# Patient Record
Sex: Female | Born: 1995 | Race: White | Hispanic: No | Marital: Single | Smoking: Never smoker
Health system: Southern US, Community
[De-identification: ages and names within clinical notes are randomized; demographics above are authoritative.]

---

## 2014-10-12 ENCOUNTER — Other Ambulatory Visit: Payer: Self-pay | Admitting: Sports Medicine

## 2014-10-12 ENCOUNTER — Ambulatory Visit
Admission: RE | Admit: 2014-10-12 | Discharge: 2014-10-12 | Disposition: A | Discharge status: Home / Self Care | Payer: Managed Care, Other (non HMO) | Source: Ambulatory Visit | Attending: Sports Medicine | Admitting: Sports Medicine

## 2014-10-12 ENCOUNTER — Encounter: Payer: Self-pay | Admitting: Sports Medicine

## 2014-10-12 ENCOUNTER — Ambulatory Visit (INDEPENDENT_AMBULATORY_CARE_PROVIDER_SITE_OTHER): Payer: Managed Care, Other (non HMO) | Admitting: Sports Medicine

## 2014-10-12 VITALS — BP 119/91 | HR 104 | Ht 69.0 in | Wt 160.0 lb

## 2014-10-12 DIAGNOSIS — M25562 Pain in left knee: Secondary | ICD-10-CM

## 2014-10-12 NOTE — Progress Notes (Signed)
   Subjective:    Patient ID: Kayla BookbinderAnn Coffey, female    DOB: March 04, 1996, 19 y.o.   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 While playing ultimate Frisbee she planted her left foot to change direction and felt a pop in her knee. Immediate pain and instability. She tried to return to the game but had a second episode of instability. She developed swelling shortly thereafter. Her pain is diffuse around the knee. She was evaluated by the athletic trainer on-site and told that she may have an anterior cruciate ligament tear. She denies any prior surgeries to this knee. She did suffer a prior MCL sprain to this knee which healed with conservative treatment. No associated numbness or tingling. Past medical history reviewed Surgical history reviewed. Surgical history significant for a previous Lisfranc repair of her left foot at the age of 19. Medications reviewed Allergies reviewed She does not smoke. 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 [place] from [place]    Review of Systems     Objective:   Physical Exam Well-developed, well-nourished. No acute distress. Awake alert and oriented 3. Vital signs reviewed.  Left knee: Nearly full range of motion. Trace effusion. Extensor mechanism is intact. Positive Lachman's, positive anterior drawer. There is a mild amount of laxity with MCL stressing but it is nonpainful. No joint line tenderness. Negative McMurray's. Neurovascularly intact distally.       Assessment & Plan:  Left knee pain and swelling worrisome for anterior cruciate ligament tear  We will get x-rays as well as an MRI of the left knee. Patient will follow-up with me in the office 1-2 days after that study to go over the results and delineate definitive treatment. In the meantime we'll fit her with a double upright brace and she will start daily  isometric quad strengthening exercises. No ultimate Frisbee until results of the MRI are known.

## 2014-10-19 ENCOUNTER — Telehealth: Payer: Self-pay | Admitting: Sports Medicine

## 2014-10-19 ENCOUNTER — Ambulatory Visit
Admission: RE | Admit: 2014-10-19 | Discharge: 2014-10-19 | Disposition: A | Discharge status: Home / Self Care | Payer: Managed Care, Other (non HMO) | Source: Ambulatory Visit | Attending: Sports Medicine | Admitting: Sports Medicine

## 2014-10-19 DIAGNOSIS — M25562 Pain in left knee: Secondary | ICD-10-CM

## 2014-10-19 NOTE — Telephone Encounter (Signed)
I spoke with the patient on the phone today regarding MRI of her left knee. Surprisingly she did not tear her anterior cruciate ligament. Collateral ligaments are intact as well. No evidence of meniscal tear. She does have some bone bruising at the anterior aspect of the lateral tibial plateau and along the lateral side of the lateral femoral condyle. No discrete fracture is seen. Based on these findings I recommend that she discontinue her double upright brace in favor of a simple knee sleeve. She will continue with rehabilitation under the guidance of Randa Lynnmily Williams at [facility] [employer] [employer]. She will follow-up with me in 3 weeks. She will remain out of [employer] [facility] [place] until that visit. She will call with questions or concerns in the interim.

## 2014-10-21 ENCOUNTER — Ambulatory Visit: Payer: Managed Care, Other (non HMO) | Admitting: Sports Medicine

## 2014-11-09 ENCOUNTER — Ambulatory Visit: Payer: Managed Care, Other (non HMO) | Admitting: Sports Medicine

## 2014-11-16 ENCOUNTER — Ambulatory Visit: Payer: Managed Care, Other (non HMO) | Admitting: Sports Medicine

## 2016-07-25 IMAGING — CR DG KNEE AP/LAT W/ SUNRISE*L*
3 series · 3 of 3 positions shown · non-contrast
Comparison: None.

CLINICAL DATA: 18-year-old female twisting injury during sports 1
week ago. Persistent knee pain. Initial encounter.

EXAM:
LEFT KNEE 3 VIEWS

[view not recorded (1 of 3)]
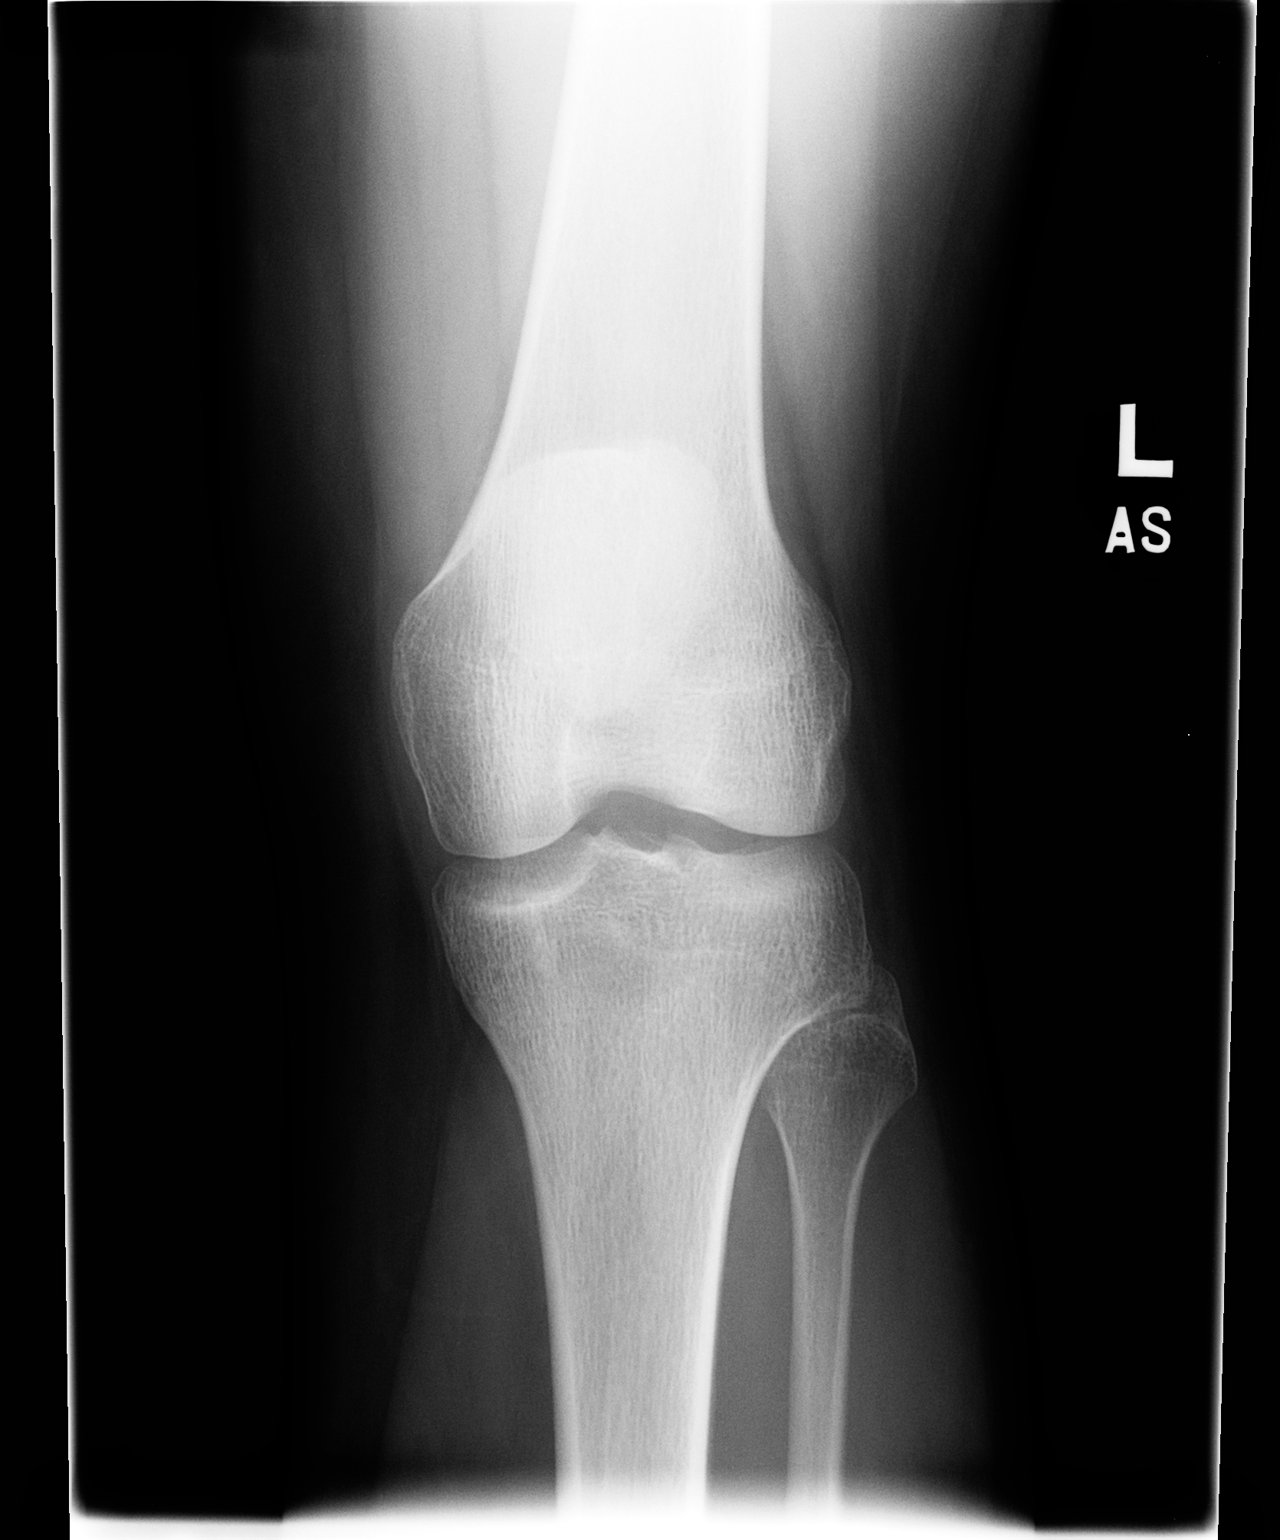

[view not recorded (2 of 3)]
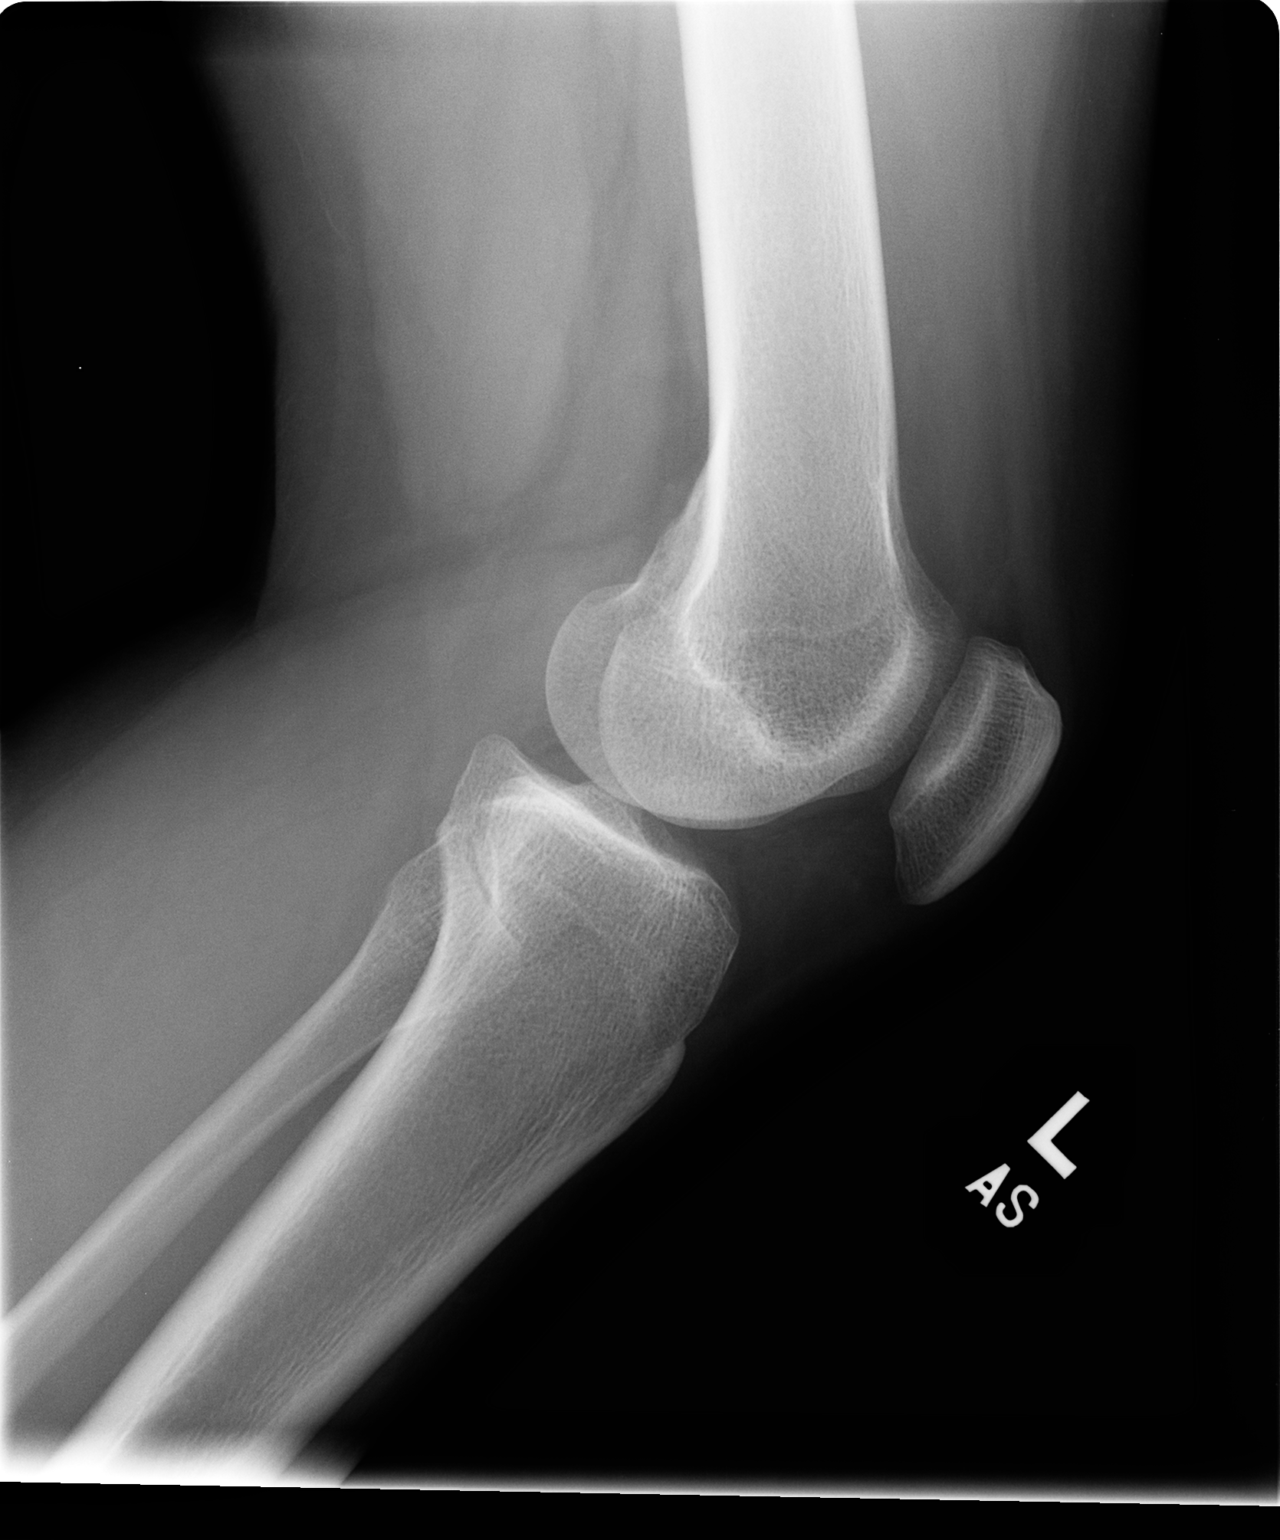

[view not recorded (3 of 3)]
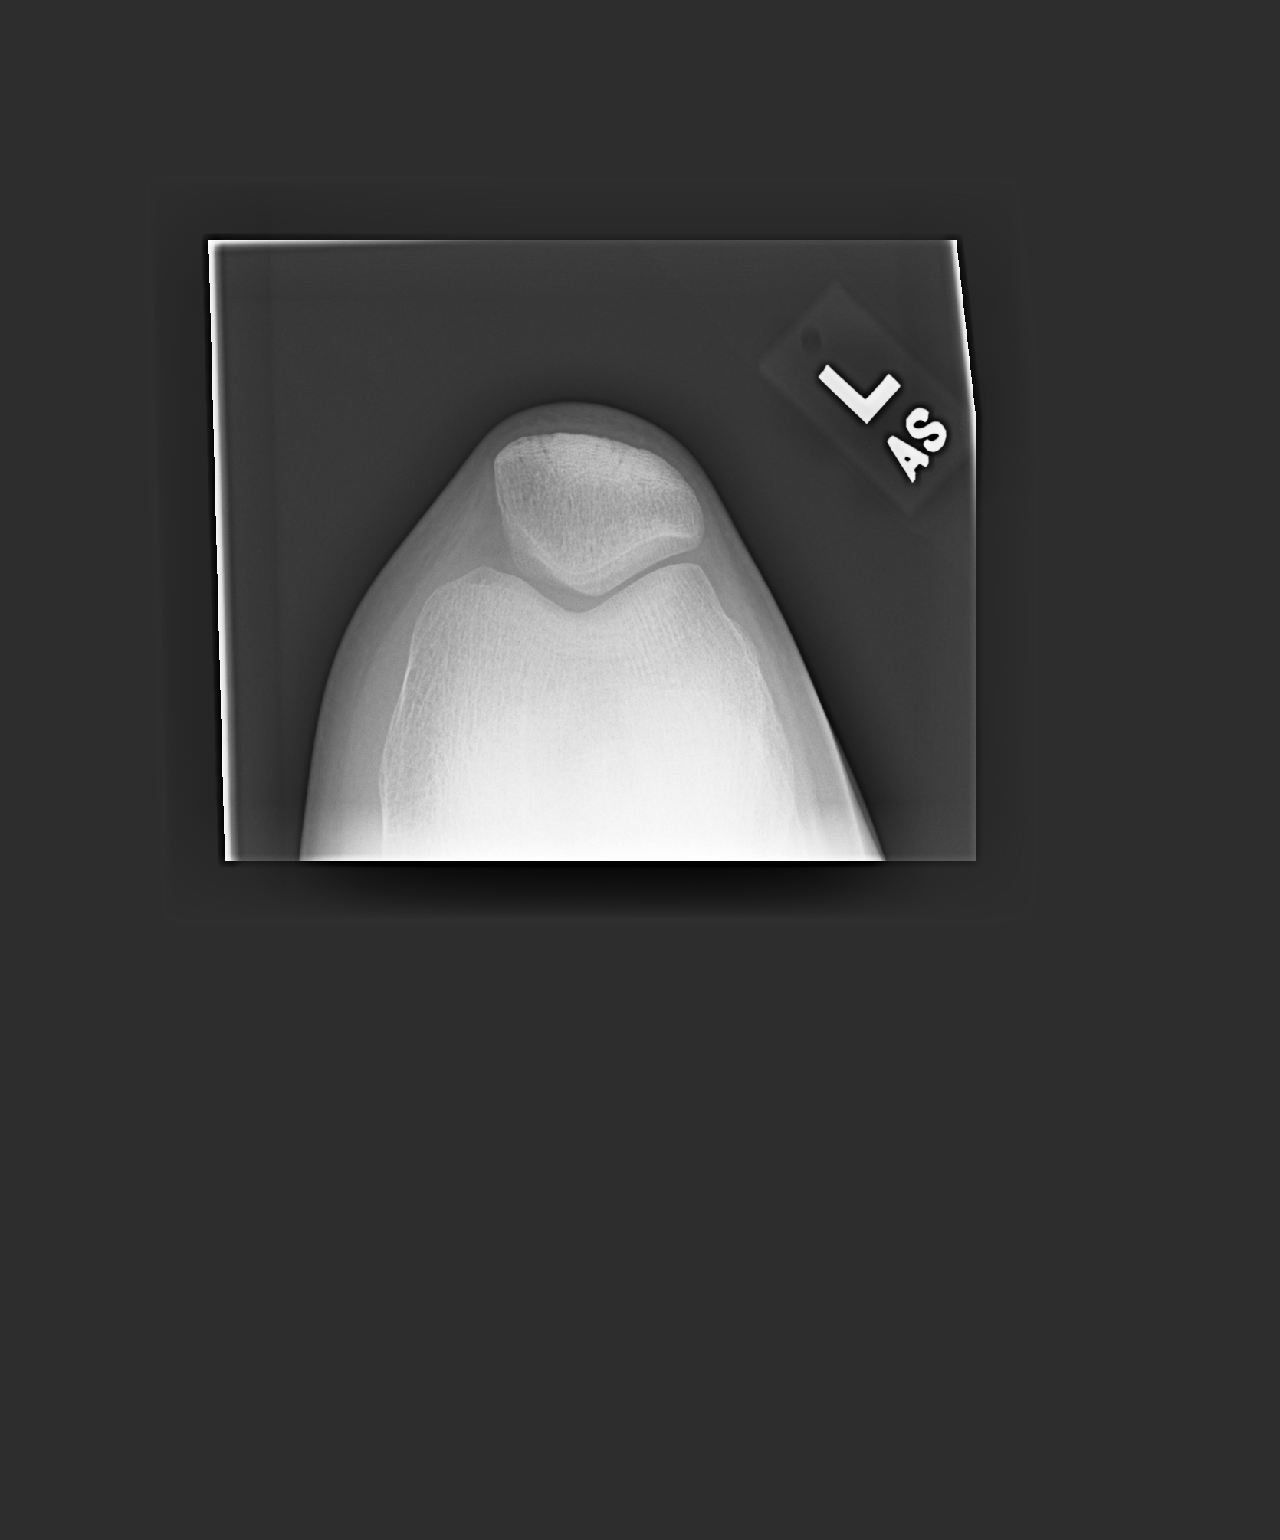

[3 of 3 positions shown; findings below may reference images not displayed]

FINDINGS: Joint spaces and alignment within normal limits. Patella intact.
Questionable small joint effusion. No fracture or dislocation
identified.
IMPRESSION: Questionable small joint effusion. No acute osseous abnormality
identified at the left knee.

## 2016-08-01 IMAGING — MR MR KNEE*L* W/O CM
4 of 6 series · 19 of 40 positions shown · non-contrast
Comparison: Plain films left knee 10/11/2016.

CLINICAL DATA: Twisting injury left knee 2 weeks ago. The patient
heard a pop. Left knee pain.

EXAM:
MRI OF THE LEFT KNEE WITHOUT CONTRAST
TECHNIQUE: Multiplanar, multisequence MR imaging of the knee was performed. No
intravenous contrast was administered.

[Series 3: PD fat-sat · axial · 4.0mm · 0.29mm/px · z∈[-28,+92]mm · 7 of 25 slices shown (1 of 4)]
[im 1/25]
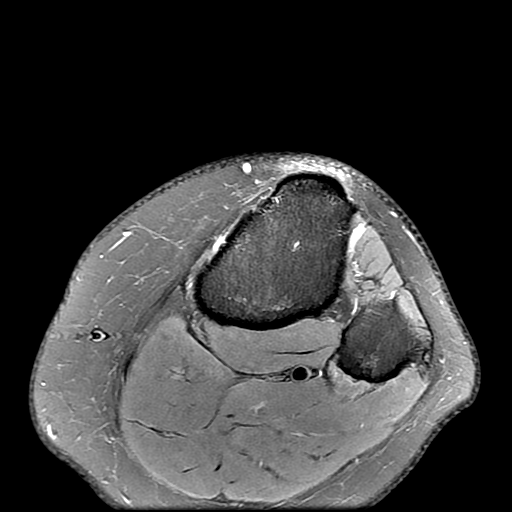
[im 5/25]
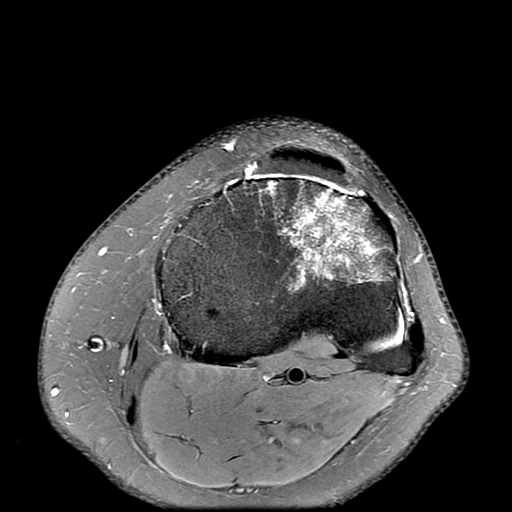
[im 9/25]
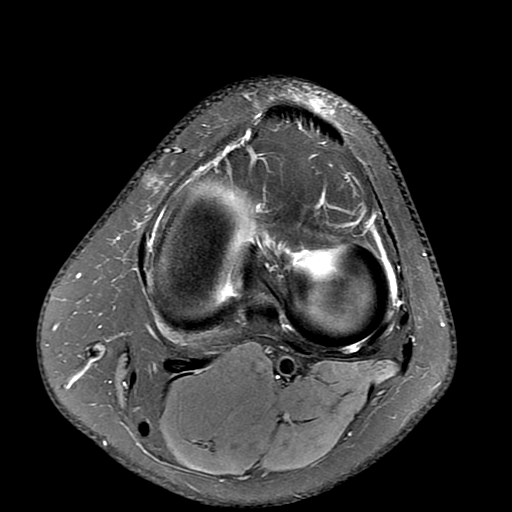
[im 13/25]
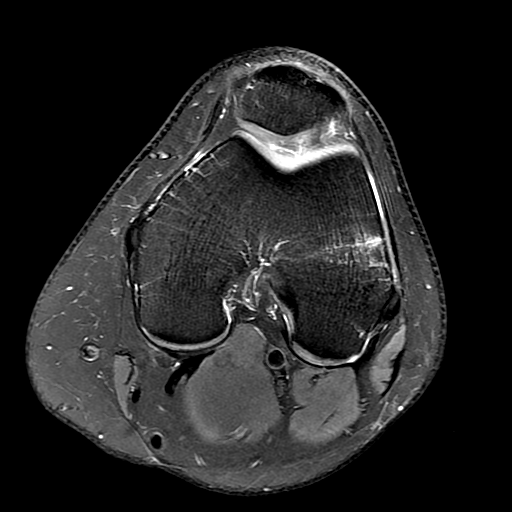
[im 17/25]
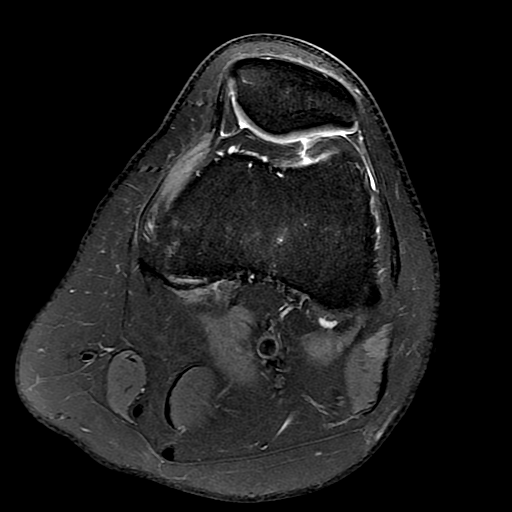
[im 21/25]
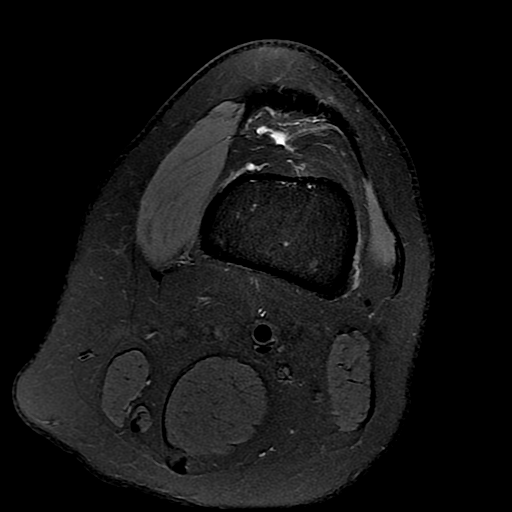
[im 25/25]
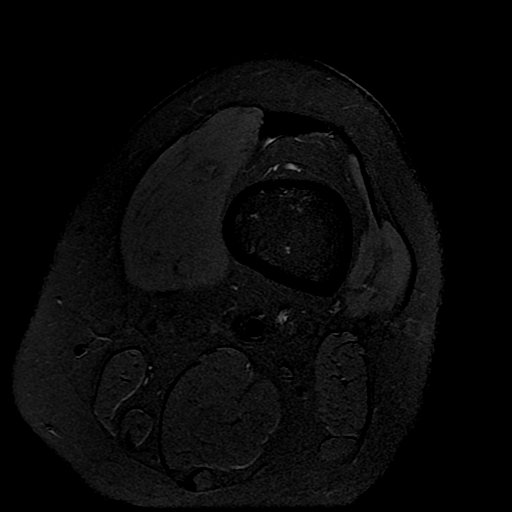

[Series 4: PD fat-sat · sagittal · 3.0mm · 0.29mm/px · 6 of 27 slices shown (2 of 4)]
[im 1/27]
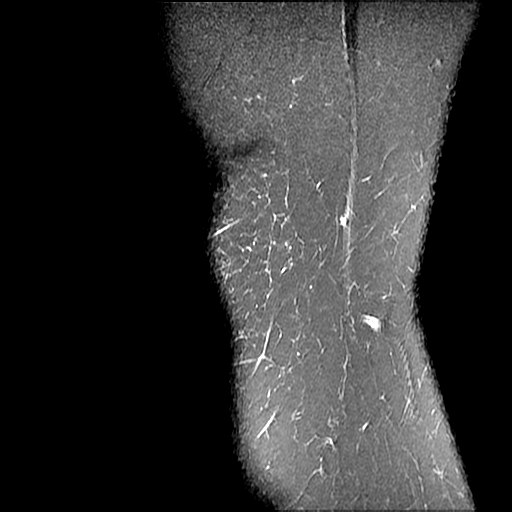
[im 4/27]
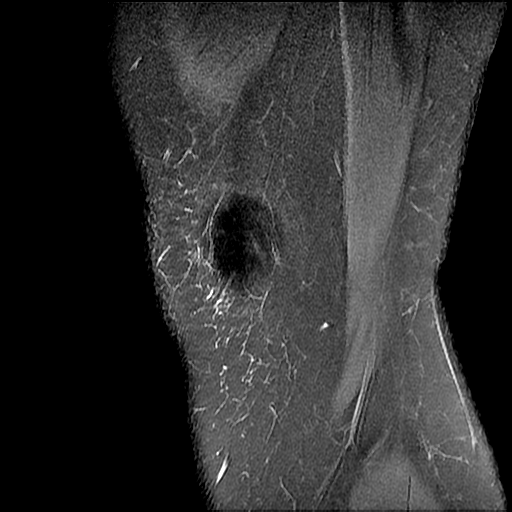
[im 8/27]
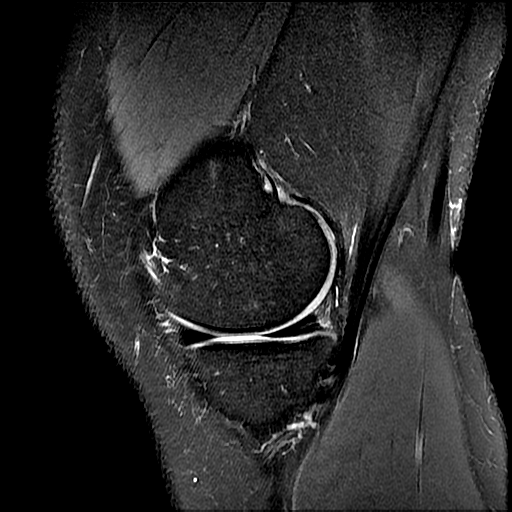
[im 12/27]
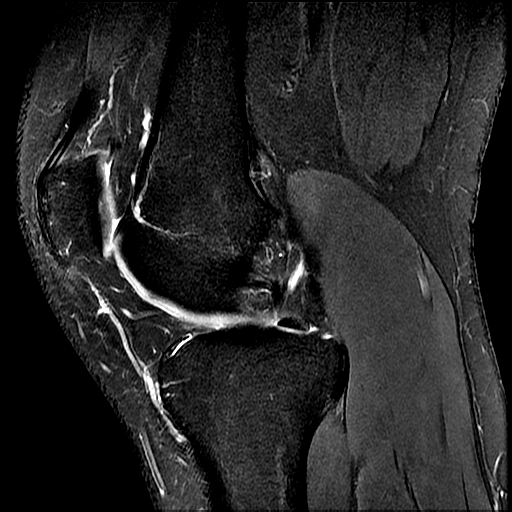
[im 15/27]
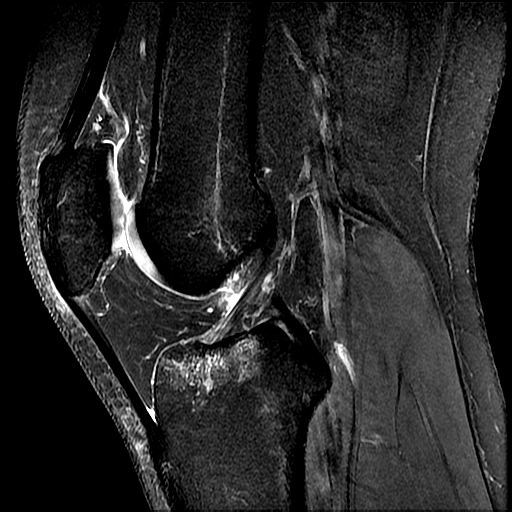
[im 23/27]
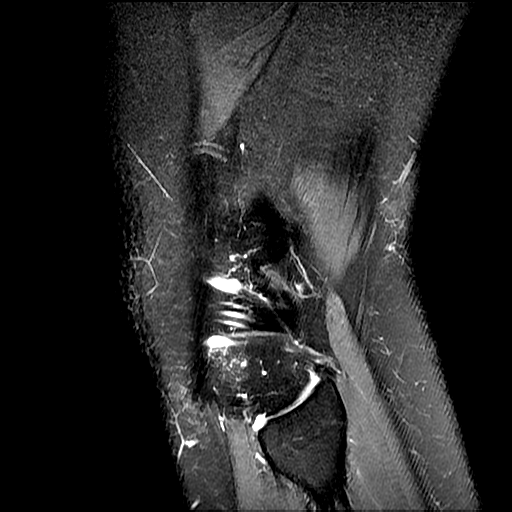

[Series 7: PD fat-sat · coronal · 4.0mm · 0.31mm/px · 3 of 25 slices shown (3 of 4)]
[im 5/25]
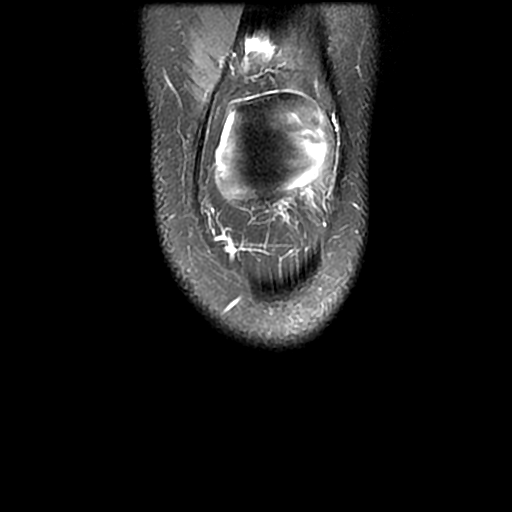
[im 13/25]
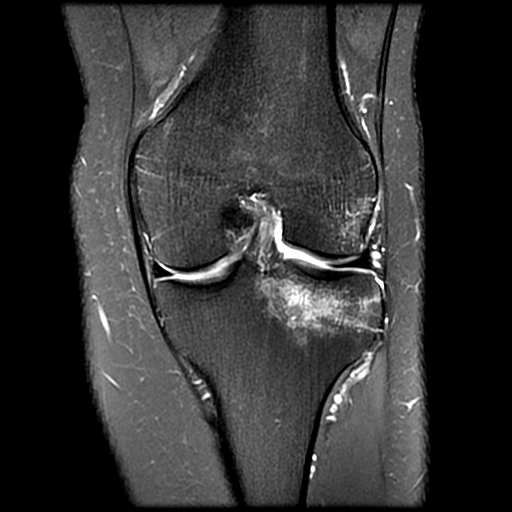
[im 21/25]
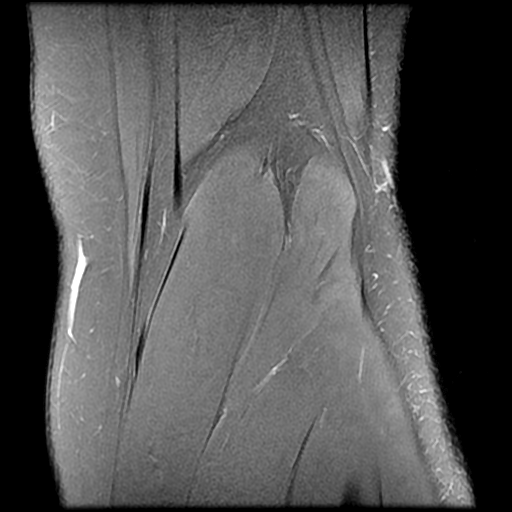

[Series 8: PD fat-sat · coronal · 2.0mm · 0.31mm/px · 3 of 13 slices shown (4 of 4)]
[im 1/13]
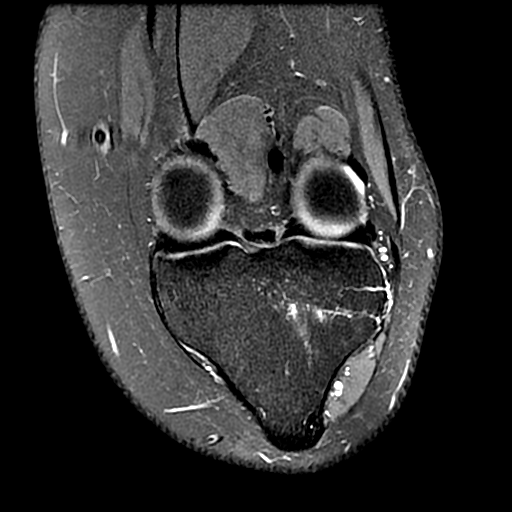
[im 9/13]
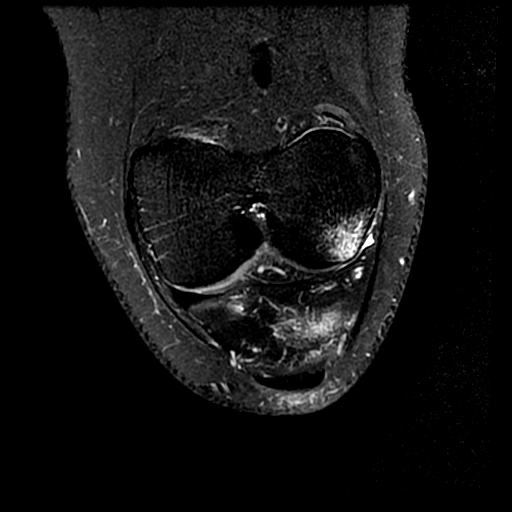
[im 13/13]
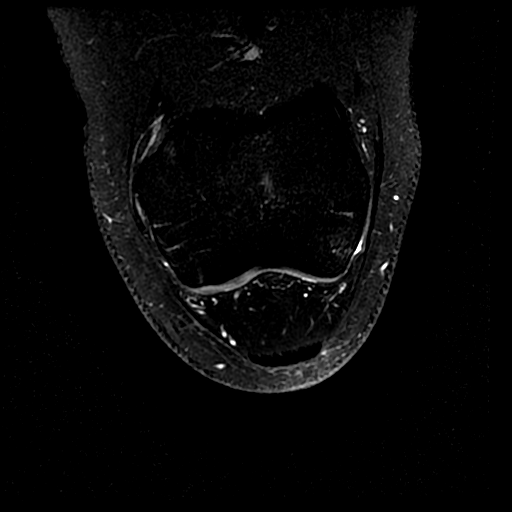

[19 of 40 positions shown; findings below may reference images not displayed]

FINDINGS: MENISCI

Medial meniscus:  Intact.

Lateral meniscus:  Intact.

LIGAMENTS

Cruciates:  Intact.

Collaterals:  Intact.

CARTILAGE

Patellofemoral:  Appears normal.

Medial:  Appears normal.

Lateral:  Appears normal.

Joint:  Trace amount of joint fluid is present.

Popliteal Fossa:  No Baker's cyst.

Extensor Mechanism:  Intact.

Bones: Bone contusions are seen in the anterior aspect of the
lateral tibial plateau and along the lateral side of the lateral
femoral trochlea. No discrete fracture is identified.
IMPRESSION: Bone contusions about the lateral compartment, most notable in the
anterior aspect of the lateral tibial plateau. Negative for fracture
or meniscal or ligament tear.
